# Patient Record
Sex: Female | Born: 1965 | Race: White | Hispanic: Yes | Marital: Married | Smoking: Never smoker
Health system: Southern US, Community
[De-identification: ages and names within clinical notes are randomized; demographics above are authoritative.]

## PROBLEM LIST (undated history)

## (undated) DIAGNOSIS — E119 Type 2 diabetes mellitus without complications: Secondary | ICD-10-CM

## (undated) DIAGNOSIS — E785 Hyperlipidemia, unspecified: Secondary | ICD-10-CM

## (undated) HISTORY — DX: Type 2 diabetes mellitus without complications: E11.9

## (undated) HISTORY — PX: TUBAL LIGATION: SHX77

## (undated) HISTORY — DX: Hyperlipidemia, unspecified: E78.5

---

## 2003-12-16 ENCOUNTER — Ambulatory Visit (HOSPITAL_COMMUNITY): Admission: RE | Admit: 2003-12-16 | Discharge: 2003-12-16 | Payer: Self-pay | Admitting: Obstetrics & Gynecology

## 2009-03-17 ENCOUNTER — Ambulatory Visit (HOSPITAL_COMMUNITY): Admission: RE | Admit: 2009-03-17 | Discharge: 2009-03-17 | Payer: Self-pay | Admitting: Obstetrics & Gynecology

## 2009-03-27 ENCOUNTER — Encounter: Admission: RE | Admit: 2009-03-27 | Discharge: 2009-03-27 | Payer: Self-pay | Admitting: Obstetrics & Gynecology

## 2009-10-09 ENCOUNTER — Encounter: Admission: RE | Admit: 2009-10-09 | Discharge: 2009-10-09 | Payer: Self-pay | Admitting: Obstetrics & Gynecology

## 2010-04-11 ENCOUNTER — Encounter: Payer: Self-pay | Admitting: Obstetrics & Gynecology

## 2010-04-12 ENCOUNTER — Encounter: Payer: Self-pay | Admitting: Obstetrics & Gynecology

## 2010-05-24 ENCOUNTER — Other Ambulatory Visit: Payer: Self-pay | Admitting: Obstetrics & Gynecology

## 2010-05-24 DIAGNOSIS — Z09 Encounter for follow-up examination after completed treatment for conditions other than malignant neoplasm: Secondary | ICD-10-CM

## 2010-06-11 ENCOUNTER — Ambulatory Visit
Admission: RE | Admit: 2010-06-11 | Discharge: 2010-06-11 | Disposition: A | Discharge status: Home / Self Care | Payer: BC Managed Care – PPO | Source: Ambulatory Visit | Attending: Obstetrics & Gynecology | Admitting: Obstetrics & Gynecology

## 2010-06-11 DIAGNOSIS — Z09 Encounter for follow-up examination after completed treatment for conditions other than malignant neoplasm: Secondary | ICD-10-CM

## 2010-09-24 ENCOUNTER — Encounter (HOSPITAL_COMMUNITY): Payer: Self-pay | Admitting: Radiology

## 2010-09-24 ENCOUNTER — Emergency Department (HOSPITAL_COMMUNITY)
Admission: EM | Admit: 2010-09-24 | Discharge: 2010-09-24 | Disposition: A | Discharge status: Home / Self Care | Payer: No Typology Code available for payment source | Attending: Emergency Medicine | Admitting: Emergency Medicine

## 2010-09-24 ENCOUNTER — Emergency Department (HOSPITAL_COMMUNITY): Payer: No Typology Code available for payment source

## 2010-09-24 DIAGNOSIS — S139XXA Sprain of joints and ligaments of unspecified parts of neck, initial encounter: Secondary | ICD-10-CM | POA: Insufficient documentation

## 2010-09-24 DIAGNOSIS — S335XXA Sprain of ligaments of lumbar spine, initial encounter: Secondary | ICD-10-CM | POA: Insufficient documentation

## 2010-09-24 DIAGNOSIS — S060X0A Concussion without loss of consciousness, initial encounter: Secondary | ICD-10-CM | POA: Insufficient documentation

## 2011-01-17 ENCOUNTER — Ambulatory Visit: Payer: No Typology Code available for payment source | Admitting: Internal Medicine

## 2011-07-13 ENCOUNTER — Other Ambulatory Visit: Payer: Self-pay | Admitting: Obstetrics & Gynecology

## 2011-07-13 DIAGNOSIS — R921 Mammographic calcification found on diagnostic imaging of breast: Secondary | ICD-10-CM

## 2011-07-22 ENCOUNTER — Ambulatory Visit
Admission: RE | Admit: 2011-07-22 | Discharge: 2011-07-22 | Disposition: A | Discharge status: Home / Self Care | Payer: No Typology Code available for payment source | Source: Ambulatory Visit | Attending: Obstetrics & Gynecology | Admitting: Obstetrics & Gynecology

## 2011-07-22 DIAGNOSIS — R921 Mammographic calcification found on diagnostic imaging of breast: Secondary | ICD-10-CM

## 2013-01-15 ENCOUNTER — Other Ambulatory Visit: Payer: Self-pay

## 2013-01-15 DIAGNOSIS — Z1231 Encounter for screening mammogram for malignant neoplasm of breast: Secondary | ICD-10-CM

## 2013-02-08 ENCOUNTER — Ambulatory Visit
Admission: RE | Admit: 2013-02-08 | Discharge: 2013-02-08 | Disposition: A | Discharge status: Home / Self Care | Payer: 59 | Source: Ambulatory Visit

## 2013-02-08 DIAGNOSIS — Z1231 Encounter for screening mammogram for malignant neoplasm of breast: Secondary | ICD-10-CM

## 2013-02-28 ENCOUNTER — Ambulatory Visit: Payer: 59 | Admitting: Obstetrics & Gynecology

## 2013-04-29 ENCOUNTER — Ambulatory Visit: Payer: 59 | Admitting: Obstetrics & Gynecology

## 2013-09-12 ENCOUNTER — Ambulatory Visit (INDEPENDENT_AMBULATORY_CARE_PROVIDER_SITE_OTHER): Payer: 59

## 2013-09-12 ENCOUNTER — Ambulatory Visit (INDEPENDENT_AMBULATORY_CARE_PROVIDER_SITE_OTHER): Payer: 59 | Admitting: Family Medicine

## 2013-09-12 VITALS — BP 124/72 | HR 67 | Temp 98.4°F | Resp 20 | Ht 62.5 in | Wt 176.0 lb

## 2013-09-12 DIAGNOSIS — M542 Cervicalgia: Secondary | ICD-10-CM

## 2013-09-12 DIAGNOSIS — S161XXA Strain of muscle, fascia and tendon at neck level, initial encounter: Secondary | ICD-10-CM

## 2013-09-12 DIAGNOSIS — S139XXA Sprain of joints and ligaments of unspecified parts of neck, initial encounter: Secondary | ICD-10-CM

## 2013-09-12 DIAGNOSIS — N912 Amenorrhea, unspecified: Secondary | ICD-10-CM

## 2013-09-12 LAB — POCT URINE PREGNANCY: Preg Test, Ur: NEGATIVE

## 2013-09-12 MED ORDER — IBUPROFEN 800 MG PO TABS: 800.0000 mg | ORAL_TABLET | Freq: Three times a day (TID) | ORAL | Status: AC | PRN

## 2013-09-12 MED ORDER — METHOCARBAMOL 500 MG PO TABS: 500.0000 mg | ORAL_TABLET | Freq: Every evening | ORAL | Status: AC | PRN
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
## 2014-02-04 ENCOUNTER — Other Ambulatory Visit: Payer: Self-pay

## 2014-02-04 DIAGNOSIS — Z1231 Encounter for screening mammogram for malignant neoplasm of breast: Secondary | ICD-10-CM

## 2014-02-24 ENCOUNTER — Ambulatory Visit
Admission: RE | Admit: 2014-02-24 | Discharge: 2014-02-24 | Disposition: A | Discharge status: Home / Self Care | Payer: Managed Care, Other (non HMO) | Source: Ambulatory Visit

## 2014-02-24 DIAGNOSIS — Z1231 Encounter for screening mammogram for malignant neoplasm of breast: Secondary | ICD-10-CM

## 2014-03-17 ENCOUNTER — Encounter: Payer: Self-pay | Admitting: *Deleted

## 2014-06-05 ENCOUNTER — Ambulatory Visit: Payer: 59 | Admitting: Obstetrics

## 2014-06-12 ENCOUNTER — Ambulatory Visit: Payer: 59 | Admitting: Obstetrics

## 2014-08-28 ENCOUNTER — Ambulatory Visit: Payer: 59 | Admitting: Obstetrics

## 2015-02-23 ENCOUNTER — Other Ambulatory Visit: Payer: Self-pay

## 2015-02-23 DIAGNOSIS — Z1231 Encounter for screening mammogram for malignant neoplasm of breast: Secondary | ICD-10-CM

## 2015-03-13 ENCOUNTER — Ambulatory Visit
Admission: RE | Admit: 2015-03-13 | Discharge: 2015-03-13 | Disposition: A | Discharge status: Home / Self Care | Payer: 59 | Source: Ambulatory Visit

## 2015-03-13 DIAGNOSIS — Z1231 Encounter for screening mammogram for malignant neoplasm of breast: Secondary | ICD-10-CM

## 2016-02-26 ENCOUNTER — Other Ambulatory Visit: Payer: Self-pay | Admitting: Obstetrics

## 2016-02-26 DIAGNOSIS — Z1231 Encounter for screening mammogram for malignant neoplasm of breast: Secondary | ICD-10-CM

## 2016-03-24 ENCOUNTER — Encounter: Payer: Self-pay | Admitting: Certified Nurse Midwife

## 2016-03-24 ENCOUNTER — Ambulatory Visit
Admission: RE | Admit: 2016-03-24 | Discharge: 2016-03-24 | Disposition: A | Discharge status: Home / Self Care | Payer: 59 | Source: Ambulatory Visit | Attending: Obstetrics | Admitting: Obstetrics

## 2016-03-24 ENCOUNTER — Ambulatory Visit (INDEPENDENT_AMBULATORY_CARE_PROVIDER_SITE_OTHER): Payer: 59 | Admitting: Certified Nurse Midwife

## 2016-03-24 VITALS — BP 143/84 | HR 83 | Temp 98.0°F | Wt 196.5 lb

## 2016-03-24 DIAGNOSIS — Z01419 Encounter for gynecological examination (general) (routine) without abnormal findings: Secondary | ICD-10-CM

## 2016-03-24 DIAGNOSIS — Z1231 Encounter for screening mammogram for malignant neoplasm of breast: Secondary | ICD-10-CM

## 2016-03-24 NOTE — Progress Notes (Signed)
Subjective:      Heather Hammond is a 51 y.o. female here for a routine exam.  Current complaints: vaginal dryness with sexual intercourse and occasional hot flashes.  Samples of estrace and Replens given to patient for vaginal dryness.  Amenorrhea >2 years, does not remember her last period.  Is employed at a [employer].  Married, occasionally sexually active. Mom had BCA 5 years ago.  Has mammogram scheduled for later today.   Also has dental exam today.  Diabetes runs on her father side of the family.  Declines STD testing.  Declines HRT.      Personal health questionnaire:  Is patient Heather Hammond, have a family history of breast and/or ovarian cancer: yes Is there a family history of uterine cancer diagnosed at age < 41, gastrointestinal cancer, urinary tract cancer, family member who is a Personnel officer syndrome-associated carrier: no Is the patient overweight and hypertensive, family history of diabetes, personal history of gestational diabetes, preeclampsia or PCOS: yes Is patient over 54, have PCOS,  family history of premature CHD under age 1, diabetes, smoke, have hypertension or peripheral artery disease:  yes At any time, has a partner hit, kicked or otherwise hurt or frightened you?: no Over the past 2 weeks, have you felt down, depressed or hopeless?: no Over the past 2 weeks, have you felt little interest or pleasure in doing things?:no   Gynecologic History No LMP recorded. Patient is not currently having periods (Reason: Other). Contraception: post menopausal status Last Pap: unknown. Results were: normal according to the patient Last mammogram: 03/13/15. Results were: normal  Obstetric History OB History  No data available    History reviewed. No pertinent past medical history.  Past Surgical History:  Procedure Laterality Date  . TUBAL LIGATION       Current Outpatient Prescriptions:  .  ibuprofen (ADVIL,MOTRIN) 800 MG tablet, Take 1 tablet (800 mg total) by  mouth every 8 (eight) hours as needed. (Patient not taking: Reported on 03/24/2016), Disp: 40 tablet, Rfl: 0 .  methocarbamol (ROBAXIN) 500 MG tablet, Take 1 tablet (500 mg total) by mouth at bedtime as needed for muscle spasms. (Patient not taking: Reported on 03/24/2016), Disp: 30 tablet, Rfl: 0 No Known Allergies  Social History  Substance Use Topics  . Smoking status: Never Smoker  . Smokeless tobacco: Never Used  . Alcohol use No    History reviewed. No pertinent family history.    Review of Systems  Constitutional: negative for fatigue and weight loss Respiratory: negative for cough and wheezing Cardiovascular: negative for chest pain, fatigue and palpitations Gastrointestinal: negative for abdominal pain and change in bowel habits Musculoskeletal:negative for myalgias Neurological: negative for gait problems and tremors Behavioral/Psych: negative for abusive relationship, depression Endocrine: negative for temperature intolerance    Genitourinary:negative for abnormal menstrual periods, genital lesions, hot flashes, sexual problems and vaginal discharge Integument/breast: negative for breast lump, breast tenderness, nipple discharge and skin lesion(s)    Objective:       BP (!) 143/84   Pulse 83   Temp 98 F (36.7 C) (Oral)   Wt 196 lb 8 oz (89.1 kg)   BMI 35.37 kg/m  General:   alert  Skin:   no rash or abnormalities  Lungs:   clear to auscultation bilaterally  Heart:   regular rate and rhythm, S1, S2 normal, no murmur, click, rub or gallop  Breasts:   normal without suspicious masses, skin or nipple changes or axillary nodes  Abdomen:  normal findings: no organomegaly, soft, non-tender and no hernia  Pelvis:  External genitalia: normal general appearance Urinary system: urethral meatus normal and bladder without fullness, nontender Vaginal: normal without tenderness, induration or masses Cervix: normal appearance Adnexa: normal bimanual exam Uterus: anteverted and  non-tender, normal size   Lab Review Urine pregnancy test Labs reviewed yes Radiologic studies reviewed yes   50% of 30 min visit spent on counseling and coordination of care.    Assessment:    Healthy female exam.   Postmenopausal status: vaginal atrophy  Plan:    Education reviewed: calcium supplements, depression evaluation, low fat, low cholesterol diet, safe sex/STD prevention, self breast exams, skin cancer screening and weight bearing exercise. Contraception: post menopausal status. Follow up in: 1 year. Mammogram 03/24/16  Need to obtain previous records for pap smear Possible management options include: HRT, samples of estrace and Replens vaginal creams given.  Follow up as needed.

## 2016-03-25 LAB — CERVICOVAGINAL ANCILLARY ONLY
Bacterial vaginitis: NEGATIVE
CANDIDA VAGINITIS: NEGATIVE

## 2016-03-28 LAB — CYTOLOGY - PAP
Diagnosis: NEGATIVE
HPV: NOT DETECTED

## 2017-12-01 ENCOUNTER — Other Ambulatory Visit: Payer: Self-pay | Admitting: Obstetrics

## 2017-12-01 DIAGNOSIS — Z1231 Encounter for screening mammogram for malignant neoplasm of breast: Secondary | ICD-10-CM

## 2017-12-28 ENCOUNTER — Ambulatory Visit: Payer: 59

## 2017-12-28 ENCOUNTER — Ambulatory Visit
Admission: RE | Admit: 2017-12-28 | Discharge: 2017-12-28 | Disposition: A | Discharge status: Home / Self Care | Payer: 59 | Source: Ambulatory Visit | Attending: Obstetrics | Admitting: Obstetrics

## 2017-12-28 DIAGNOSIS — Z1231 Encounter for screening mammogram for malignant neoplasm of breast: Secondary | ICD-10-CM

## 2018-06-06 ENCOUNTER — Encounter: Payer: Self-pay | Admitting: Physician Assistant

## 2019-02-07 ENCOUNTER — Other Ambulatory Visit: Payer: Self-pay | Admitting: Physician Assistant

## 2019-02-07 DIAGNOSIS — Z1231 Encounter for screening mammogram for malignant neoplasm of breast: Secondary | ICD-10-CM

## 2019-04-03 ENCOUNTER — Ambulatory Visit: Payer: Managed Care, Other (non HMO)

## 2020-06-15 IMAGING — MG DIGITAL SCREENING BILATERAL MAMMOGRAM WITH TOMO AND CAD
8 series · 8 of 24 positions shown · non-contrast
Comparison: Previous exam(s).

CLINICAL DATA: Screening.

EXAM:
DIGITAL SCREENING BILATERAL MAMMOGRAM WITH TOMO AND CAD

[R CC synth-2D]
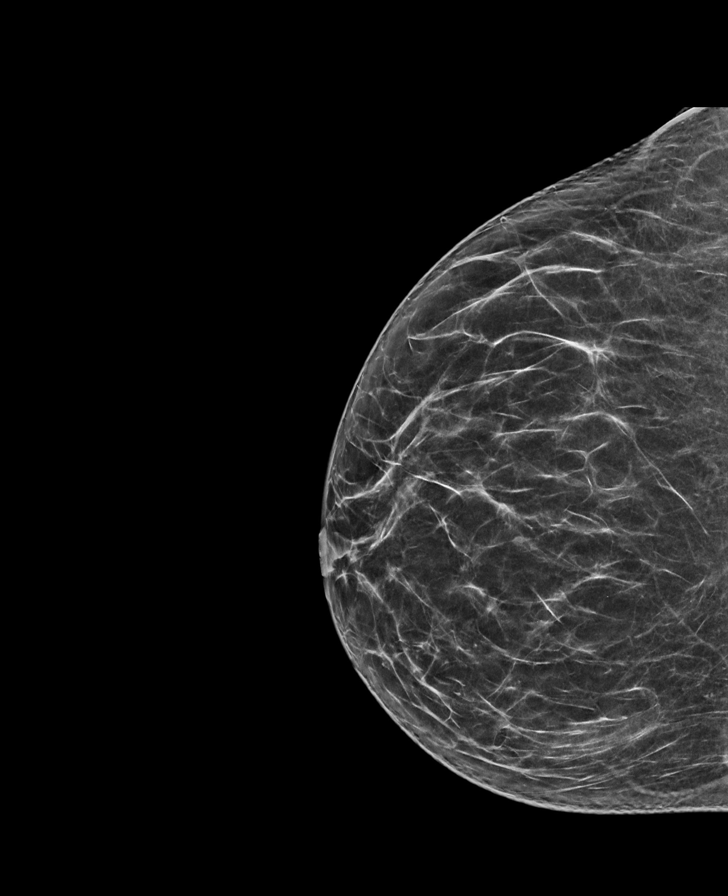

[L CC synth-2D]
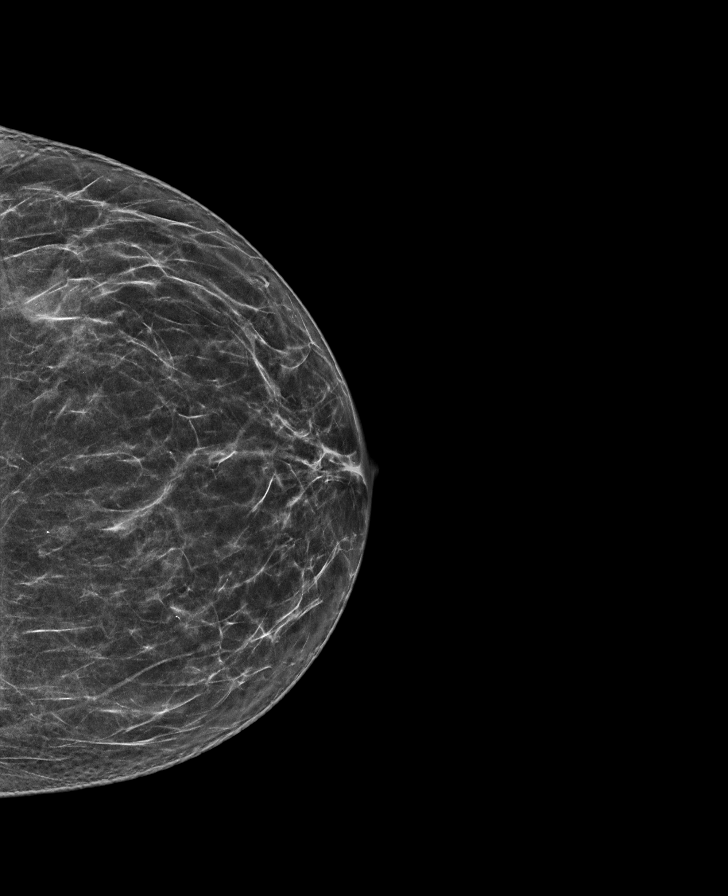

[R MLO synth-2D]
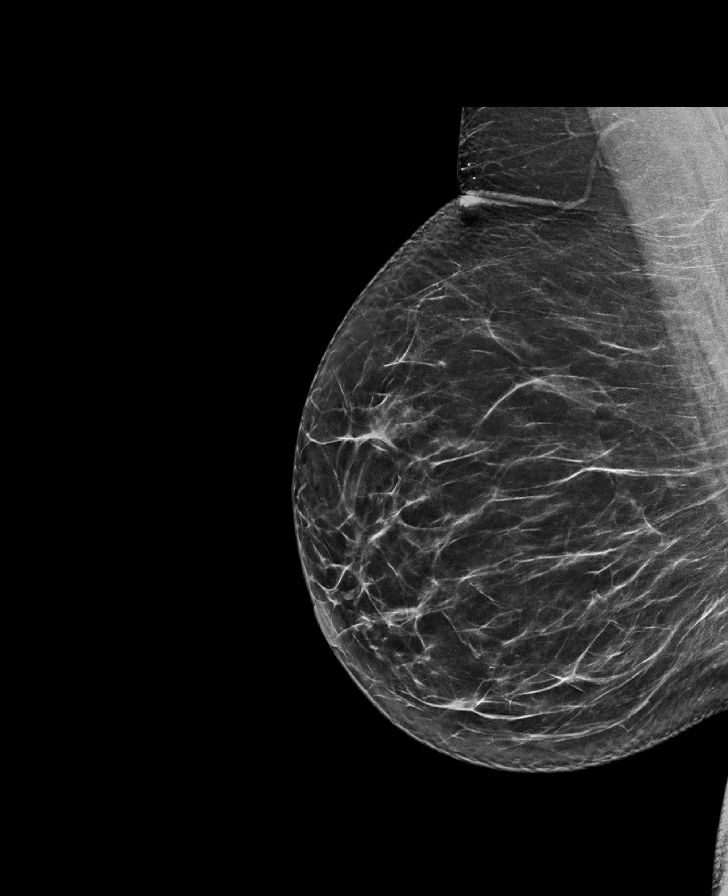

[L MLO synth-2D]
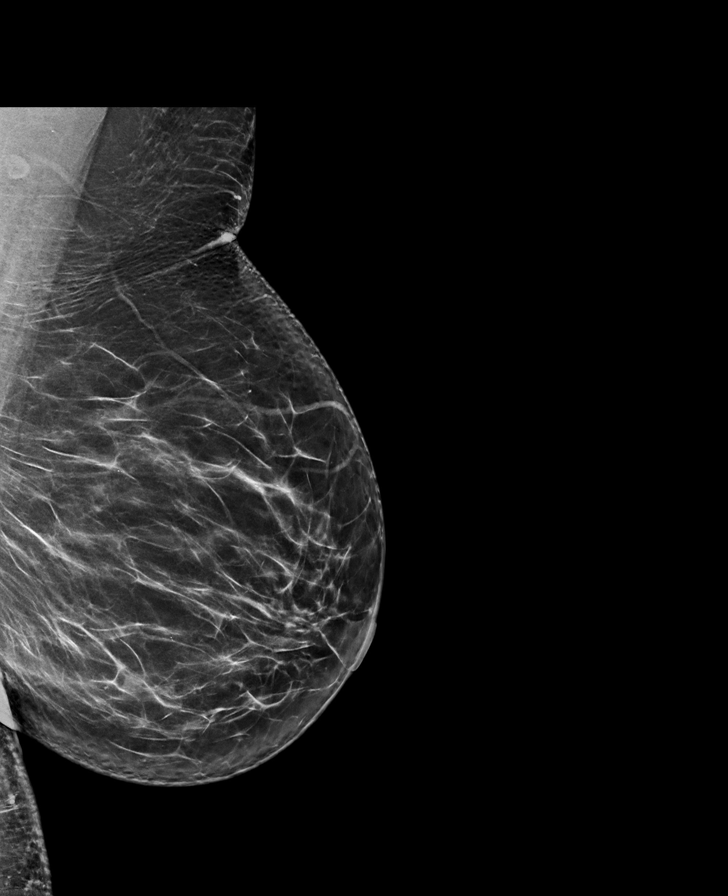

[R MLO tomo · tomo slice 39/78.0]
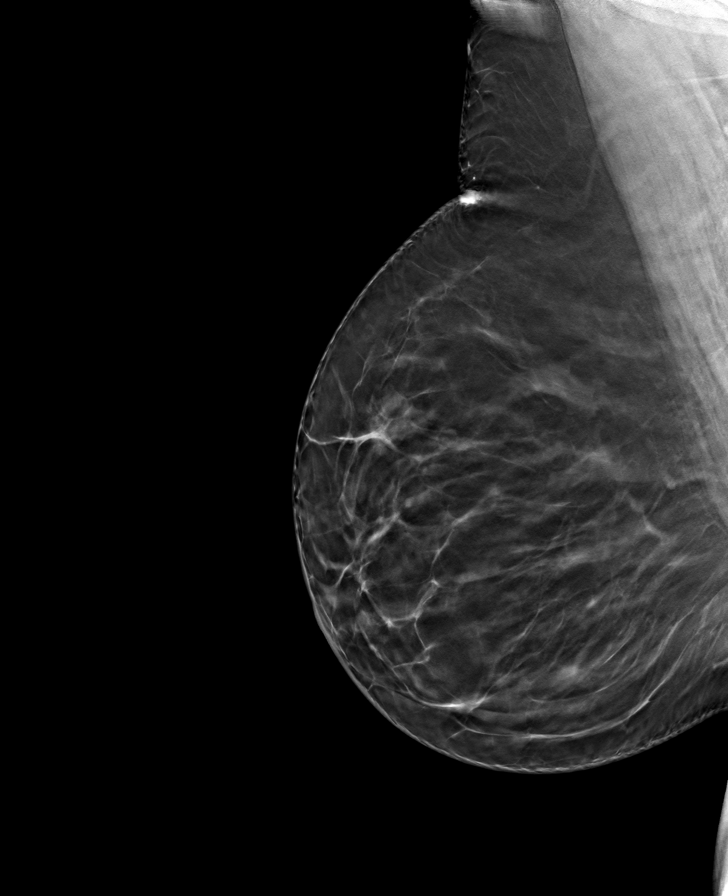

[L MLO tomo · tomo slice 42/83.0]
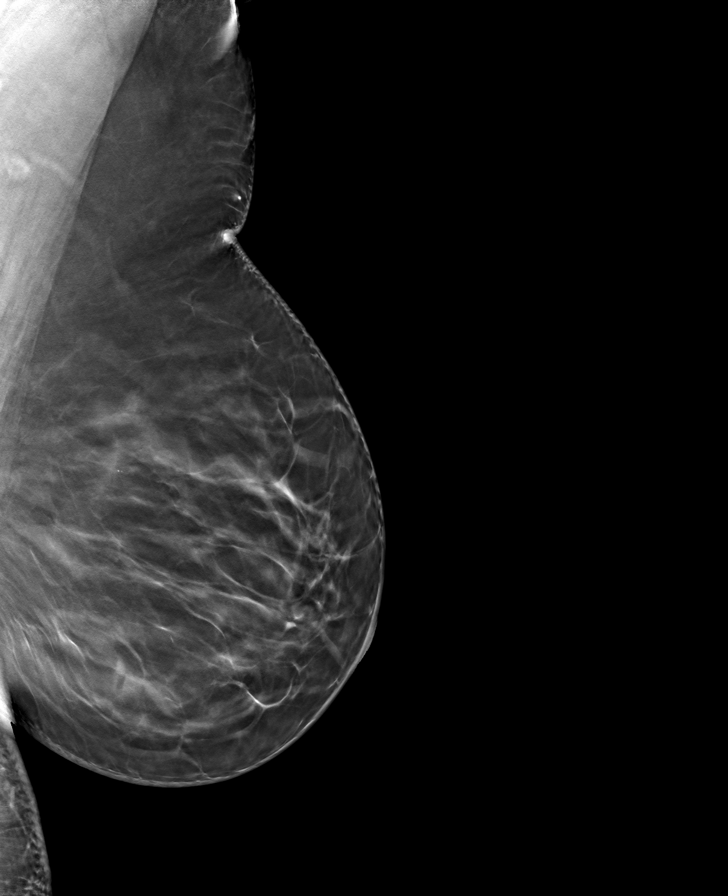

[L CC tomo · tomo slice 33/66.0]
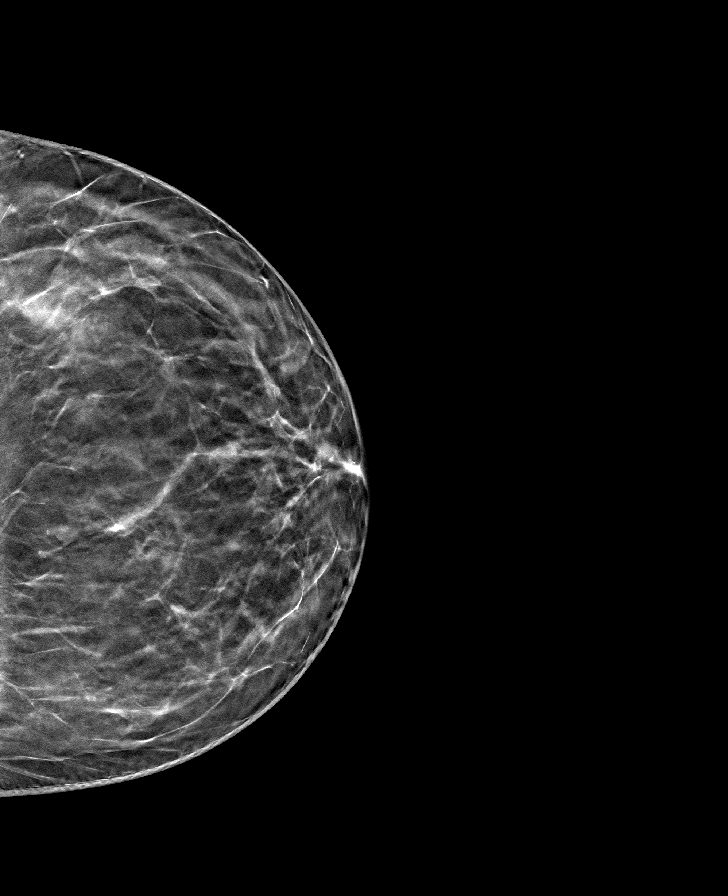

[R CC tomo · tomo slice 35/70.0]
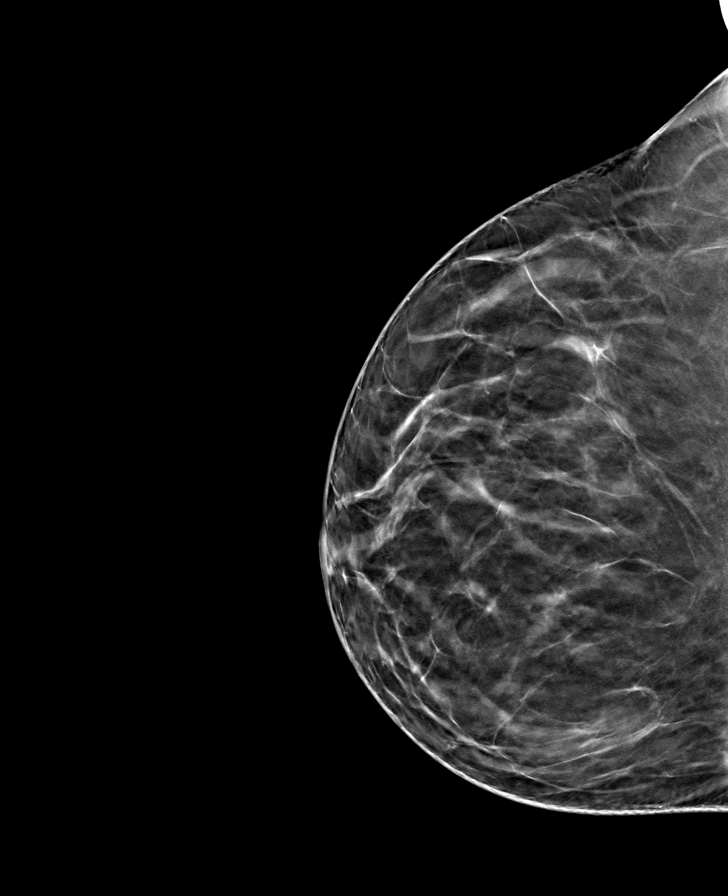

[8 of 24 positions shown; findings below may reference images not displayed]

ACR Breast Density Category b: There are scattered areas of
fibroglandular density.
FINDINGS: There are no findings suspicious for malignancy. Images were
processed with CAD.
IMPRESSION: No mammographic evidence of malignancy. A result letter of this
screening mammogram will be mailed directly to the patient.

RECOMMENDATION:
Screening mammogram in one year. (Code:[id number])

BI-RADS CATEGORY  1: Negative.

## 2022-10-21 ENCOUNTER — Encounter: Payer: Self-pay | Admitting: Internal Medicine

## 2022-11-10 ENCOUNTER — Ambulatory Visit: Payer: Managed Care, Other (non HMO)

## 2022-11-10 VITALS — Ht 62.0 in | Wt 185.0 lb

## 2022-11-10 DIAGNOSIS — Z1211 Encounter for screening for malignant neoplasm of colon: Secondary | ICD-10-CM

## 2022-11-10 MED ORDER — NA SULFATE-K SULFATE-MG SULF 17.5-3.13-1.6 GM/177ML PO SOLN: 1.0000 | Freq: Once | ORAL | 0 refills | Status: AC

## 2022-11-10 NOTE — Progress Notes (Signed)

## 2022-11-14 ENCOUNTER — Encounter: Payer: Self-pay | Admitting: Internal Medicine

## 2022-11-19 ENCOUNTER — Encounter: Payer: Self-pay | Admitting: Certified Registered Nurse Anesthetist

## 2022-11-22 ENCOUNTER — Encounter: Payer: Self-pay | Admitting: Internal Medicine

## 2022-11-22 ENCOUNTER — Ambulatory Visit (AMBULATORY_SURGERY_CENTER): Payer: 59 | Admitting: Internal Medicine

## 2022-11-22 VITALS — BP 144/90 | HR 53 | Temp 97.5°F | Resp 12 | Ht 62.0 in | Wt 185.0 lb

## 2022-11-22 DIAGNOSIS — D123 Benign neoplasm of transverse colon: Secondary | ICD-10-CM

## 2022-11-22 DIAGNOSIS — Z1211 Encounter for screening for malignant neoplasm of colon: Secondary | ICD-10-CM | POA: Diagnosis present

## 2022-11-22 MED ORDER — SODIUM CHLORIDE 0.9 % IV SOLN: 500.0000 mL | INTRAVENOUS | Status: DC

## 2022-11-22 NOTE — Progress Notes (Signed)
GASTROENTEROLOGY PROCEDURE H&P NOTE   Primary Care Physician: Patient, No Pcp Per    Reason for Procedure:   CRC screening  Plan:    Colonoscopy  Patient is appropriate for endoscopic procedure(s) in the ambulatory ([facility]) setting.  The nature of the procedure, as well as the risks, benefits, and alternatives were carefully and thoroughly reviewed with the patient. Ample time for discussion and questions allowed. The patient understood, was satisfied, and agreed to proceed.     HPI: Heather Hammond is a 57 y.o. female who presents for colonoscopy.  Medical history as below.  Tolerated the prep.  No recent chest pain or shortness of breath.  No abdominal pain today.  Past Medical History:  Diagnosis Date   Diabetes mellitus without complication (HCC)    Hyperlipidemia     Past Surgical History:  Procedure Laterality Date   TUBAL LIGATION      Prior to Admission medications   Medication Sig Start Date End Date Taking? Authorizing Provider  Cholecalciferol (VITAMIN D3) 1.25 MG (50000 UT) CAPS Take 1 capsule by mouth once a week.   Yes [provider]  ibuprofen (ADVIL,MOTRIN) 800 MG tablet Take 1 tablet (800 mg total) by mouth every 8 (eight) hours as needed. 09/12/13   Ethelda Chick, MD  methocarbamol (ROBAXIN) 500 MG tablet Take 1 tablet (500 mg total) by mouth at bedtime as needed for muscle spasms. Patient not taking: Reported on 11/10/2022 09/12/13   Ethelda Chick, MD    Current Outpatient Medications  Medication Sig Dispense Refill   Cholecalciferol (VITAMIN D3) 1.25 MG (50000 UT) CAPS Take 1 capsule by mouth once a week.     ibuprofen (ADVIL,MOTRIN) 800 MG tablet Take 1 tablet (800 mg total) by mouth every 8 (eight) hours as needed. 40 tablet 0   methocarbamol (ROBAXIN) 500 MG tablet Take 1 tablet (500 mg total) by mouth at bedtime as needed for muscle spasms. (Patient not taking: Reported on 11/10/2022) 30 tablet 0   Current Facility-Administered  Medications  Medication Dose Route Frequency Provider Last Rate Last Admin   0.9 %  sodium chloride infusion  500 mL Intravenous Continuous Romell Cavanah, Carie Caddy, MD        Allergies as of 11/22/2022   (No Known Allergies)    Family History  Problem Relation Age of Onset   Breast cancer Mother    Colon cancer Neg Hx    Colon polyps Neg Hx    Esophageal cancer Neg Hx    Rectal cancer Neg Hx    Stomach cancer Neg Hx     Social History   Socioeconomic History   Marital status: Married    Spouse name: Not on file   Number of children: Not on file   Years of education: Not on file   Highest education level: Not on file  Occupational History    Employer: [employer]    Comment: Armed forces operational officer  Tobacco Use   Smoking status: Never   Smokeless tobacco: Never  Substance and Sexual Activity   Alcohol use: No   Drug use: No   Sexual activity: Not on file  Other Topics Concern   Not on file  Social History Narrative   Not on file   Social Determinants of Health   Financial Resource Strain: Low Risk  (04/08/2022)   Received from [facility], [facility]   Overall Financial Resource Strain (CARDIA)    Difficulty of Paying Living Expenses: Not hard at all  Food Insecurity: No Food Insecurity (04/08/2022)   Received [facility], [facility]   Hunger Vital Sign    Worried About Running Out of Food in the Last Year: Never true    Ran Out of Food in the Last Year: Never true  Transportation Needs: No Transportation Needs (04/08/2022)   Received from [facility], [facility]   [facility] - Transportation    Lack of Transportation (Medical): No    Lack of Transportation (Non-Medical): No  Physical Activity: Unknown (03/08/2022)   Received from [facility], [facility]   Exercise Vital Sign    Days of Exercise per Week: 3 days    Minutes of Exercise per Session: Not on file  Stress: Patient Declined (03/08/2022)   Received from [facility], [facility] - Occupational Stress Questionnaire    Feeling of Stress : Patient declined  Social Connections: Somewhat Isolated (03/08/2022)   Received from [facility], [facility]   Social Network    How would you rate your social network (family, work, friends)?: Restricted participation with some degree of social isolation  Intimate Partner Violence: Not At Risk (03/08/2022)   Received from [facility], [facility]   HITS    Over the last 12 months how often did your partner physically hurt you?: 1    Over the last 12 months how often did your partner insult you or talk down to you?: 1    Over the last 12 months how often did your partner threaten you with physical harm?: 1    Over the last 12 months how often did your partner scream or curse at you?: 1    Physical Exam: Vital signs in last 24 hours: @BP  (!) 185/100   Pulse 60   Temp (!) 97.5 F (36.4 C) (Skin)   Resp 11   Ht 5\' 2"  (1.575 m)   Wt 185 lb (83.9 kg)   LMP 03/28/2010   SpO2 99%   BMI 33.84 kg/m  GEN: NAD EYE: Sclerae anicteric ENT: MMM CV: Non-tachycardic Pulm: CTA b/l GI: Soft, NT/ND NEURO:  Alert & Oriented x 3   Erick Blinks, MD  Gastroenterology  11/22/2022 1:32 PM

## 2022-11-22 NOTE — Progress Notes (Signed)
Called to room to assist during endoscopic procedure.  Patient ID and intended procedure confirmed with present staff. Received instructions for my participation in the procedure from the performing physician.  

## 2022-11-22 NOTE — Progress Notes (Signed)
Patient states there have been no changes to medical or surgical history since time of pre-visit. 

## 2022-11-22 NOTE — Patient Instructions (Signed)
Resume all of your previous medications today as ordered.  Read the handouts given to you by your recovery room nurse.  YOU HAD AN ENDOSCOPIC PROCEDURE TODAY AT THE [facility]:   Refer to the procedure report that was given to you for any specific questions about what was found during the examination.  If the procedure report does not answer your questions, please call your gastroenterologist to clarify.  If you requested that your care partner not be given the details of your procedure findings, then the procedure report has been included in a sealed envelope for you to review at your convenience later.  YOU SHOULD EXPECT: Some feelings of bloating in the abdomen. Passage of more gas than usual.  Walking can help get rid of the air that was put into your GI tract during the procedure and reduce the bloating. If you had a lower endoscopy (such as a colonoscopy or flexible sigmoidoscopy) you may notice spotting of blood in your stool or on the toilet paper. If you underwent a bowel prep for your procedure, you may not have a normal bowel movement for a few days.  Please Note:  You might notice some irritation and congestion in your nose or some drainage.  This is from the oxygen used during your procedure.  There is no need for concern and it should clear up in a day or so.  SYMPTOMS TO REPORT IMMEDIATELY:  Following lower endoscopy (colonoscopy or flexible sigmoidoscopy):  Excessive amounts of blood in the stool  Significant tenderness or worsening of abdominal pains  Swelling of the abdomen that is new, acute  Fever of 100F or higher   For urgent or emergent issues, a gastroenterologist can be reached at any hour by calling [phone]. Do not use MyChart messaging for urgent concerns.    DIET:  We do recommend a small meal at first, but then you may proceed to your regular diet.  Drink plenty of fluids but you should avoid alcoholic beverages for 24 hours.  ACTIVITY:  You  should plan to take it easy for the rest of today and you should NOT DRIVE or use heavy machinery until tomorrow (because of the sedation medicines used during the test).    FOLLOW UP: Our staff will call the number listed on your records the next business day following your procedure.  We will call around 7:15- 8:00 am to check on you and address any questions or concerns that you may have regarding the information given to you following your procedure. If we do not reach you, we will leave a message.     If any biopsies were taken you will be contacted by phone or by letter within the next 1-3 weeks.  Please call us at [phone] if you have not heard about the biopsies in 3 weeks.    SIGNATURES/CONFIDENTIALITY: You and/or your care partner have signed paperwork which will be entered into your electronic medical record.  These signatures attest to the fact that that the information above on your After Visit Summary has been reviewed and is understood.  Full responsibility of the confidentiality of this discharge information lies with you and/or your care-partner.

## 2022-11-22 NOTE — Op Note (Signed)
[facility] Patient Name: Heather Hammond Procedure Date: 11/22/2022 1:10 PM MRN: [id number] Endoscopist: Beverley Fiedler , MD, [id number] Age: 57 Referring MD:  Date of Birth: June 03, 1965 Gender: Female Account #: [id number] Procedure:                Colonoscopy Indications:              Screening for colorectal malignant neoplasm, This                            is the patient's first colonoscopy Medicines:                Monitored Anesthesia Care Procedure:                Pre-Anesthesia Assessment:                           - Prior to the procedure, a History and Physical                            was performed, and patient medications and                            allergies were reviewed. The patient's tolerance of                            previous anesthesia was also reviewed. The risks                            and benefits of the procedure and the sedation                            options and risks were discussed with the patient.                            All questions were answered, and informed consent                            was obtained. Prior Anticoagulants: The patient has                            taken no anticoagulant or antiplatelet agents. ASA                            Grade Assessment: II - A patient with mild systemic                            disease. After reviewing the risks and benefits,                            the patient was deemed in satisfactory condition to                            undergo the procedure.  After obtaining informed consent, the colonoscope                            was passed under direct vision. Throughout the                            procedure, the patient's blood pressure, pulse, and                            oxygen saturations were monitored continuously. The                            Olympus CF-HQ190L ([id number]) Colonoscope was                            introduced through the anus  and advanced to the                            terminal ileum. The patient tolerated the procedure                            well. The colonoscopy was performed without                            difficulty. Scope In: 1:39:27 PM Scope Out: 1:55:32 PM Scope Withdrawal Time: 0 hours 13 minutes 12 seconds  Total Procedure Duration: 0 hours 16 minutes 5 seconds  Findings:                 The digital rectal exam was normal.                           The terminal ileum appeared normal.                           A 3 mm polyp was found in the transverse colon. The                            polyp was sessile. The polyp was removed with a                            cold biopsy forceps. Resection and retrieval were                            complete.                           The exam was otherwise without abnormality on                            direct and retroflexion views. Complications:            No immediate complications. Estimated Blood Loss:     Estimated blood loss: none. Impression:               - The examined portion of the ileum was  normal.                           - One 3 mm polyp in the transverse colon, removed                            with a cold biopsy forceps. Resected and retrieved.                           - The examination was otherwise normal on direct                            and retroflexion views. Recommendation:           - Patient has a contact number available for                            emergencies. The signs and symptoms of potential                            delayed complications were discussed with the                            patient. Return to normal activities tomorrow.                            Written discharge instructions were provided to the                            patient.                           - Resume previous diet.                           - Continue present medications.                           - Await pathology results.                            - Repeat colonoscopy is recommended. The                            colonoscopy date will be determined after pathology                            results from today's exam become available for                            review. Beverley Fiedler, MD 11/22/2022 1:59:36 PM This report has been signed electronically.

## 2022-11-23 ENCOUNTER — Telehealth: Payer: Self-pay

## 2022-11-23 NOTE — Telephone Encounter (Signed)
  Follow up Call-     11/22/2022   12:47 PM  Call back number  Post procedure Call Back phone  # [phone]  Permission to leave phone message Yes     Patient questions:  Do you have a fever, pain , or abdominal swelling? No. Pain Score  0 *  Have you tolerated food without any problems? Yes.    Have you been able to return to your normal activities? Yes.    Do you have any questions about your discharge instructions: Diet   No. Medications  No. Follow up visit  No.  Do you have questions or concerns about your Care? No.  Actions: * If pain score is 4 or above: No action needed, pain <4.

## 2022-11-29 ENCOUNTER — Encounter: Payer: Self-pay | Admitting: Internal Medicine
